# Patient Record
Sex: Female | Born: 1975 | Race: White | Hispanic: No | Marital: Married | Smoking: Never smoker
Health system: Southern US, Community
[De-identification: ages and names within clinical notes are randomized; demographics above are authoritative.]

## PROBLEM LIST (undated history)

## (undated) DIAGNOSIS — I00 Rheumatic fever without heart involvement: Secondary | ICD-10-CM

## (undated) DIAGNOSIS — G90A Postural orthostatic tachycardia syndrome (POTS): Secondary | ICD-10-CM

## (undated) DIAGNOSIS — I498 Other specified cardiac arrhythmias: Secondary | ICD-10-CM

## (undated) HISTORY — DX: Postural orthostatic tachycardia syndrome (POTS): G90.A

## (undated) HISTORY — DX: Other specified cardiac arrhythmias: I49.8

## (undated) HISTORY — PX: LAPAROSCOPY: SHX197

## (undated) HISTORY — DX: Rheumatic fever without heart involvement: I00

---

## 2020-03-15 ENCOUNTER — Ambulatory Visit: Payer: BC Managed Care – PPO | Admitting: Cardiology

## 2020-03-15 ENCOUNTER — Encounter: Payer: Self-pay | Admitting: Cardiology

## 2020-03-15 ENCOUNTER — Other Ambulatory Visit: Payer: Self-pay

## 2020-03-15 VITALS — BP 128/88 | HR 91 | Ht 66.0 in | Wt 121.0 lb

## 2020-03-15 DIAGNOSIS — G90A Postural orthostatic tachycardia syndrome (POTS): Secondary | ICD-10-CM

## 2020-03-15 DIAGNOSIS — I00 Rheumatic fever without heart involvement: Secondary | ICD-10-CM | POA: Diagnosis not present

## 2020-03-15 DIAGNOSIS — I498 Other specified cardiac arrhythmias: Secondary | ICD-10-CM | POA: Diagnosis not present

## 2020-03-15 NOTE — Patient Instructions (Signed)
Medication Instructions:  - Your physician recommends that you continue on your current medications as directed. Please refer to the Current Medication list given to you today.  *If you need a refill on your cardiac medications before your next appointment, please call your pharmacy*   Lab Work: - none ordered  If you have labs (blood work) drawn today and your tests are completely normal, you will receive your results only by: Marland Kitchen MyChart Message (if you have MyChart) OR . A paper copy in the mail If you have any lab test that is abnormal or we need to change your treatment, we will call you to review the results.   Testing/Procedures: 1) Echocardiogram - Your physician has requested that you have an echocardiogram. Echocardiography is a painless test that uses sound waves to create images of your heart. It provides your doctor with information about the size and shape of your heart and how well your heart's chambers and valves are working. This procedure takes approximately one hour. There are no restrictions for this procedure. An IV may need to be placed during your exam to inject an image enhancing agent for more optimal pictures of your heart. Please drink some water prior to coming to your exam to hydrate your veins.   Follow-Up: At [facility], you and your health needs are our priority.  As part of our continuing mission to provide you with exceptional heart care, we have created designated Provider Care Teams.  These Care Teams include your primary Cardiologist (physician) and Advanced Practice Providers (APPs -  Physician Assistants and Nurse Practitioners) who all work together to provide you with the care you need, when you need it.  We recommend signing up for the patient portal called "MyChart".  Sign up information is provided on this After Visit Summary.  MyChart is used to connect with patients for Virtual Visits (Telemedicine).  Patients are able to view lab/test results,  encounter notes, upcoming appointments, etc.  Non-urgent messages can be sent to your provider as well.   To learn more about what you can do with MyChart, go to [email or web address].    Your next appointment:   1) with Dr. Azucena Cecil:  after your echocardiogram is complete  2) referral to Dr. Graciela Husbands: known POTS (establish care)- next available  The format for your next appointment:   In Person  Provider:   as above   Other Instructions n/a

## 2020-03-15 NOTE — Progress Notes (Signed)
Cardiology Office Note:    Date:  03/15/2020   ID:  Ann Mullen, DOB 1976-06-06, MRN [id number]  PCP:  Patient, No Pcp Per  [facility] Cardiologist:  Debbe Odea, MD  [facility] Electrophysiologist:  None   Referring MD: No ref. provider found   Chief Complaint  Patient presents with  . New Patient (Initial Visit)    Establish cardiac cardiac-POTS. Hx of Rheumatic fever as a child; Meds verbally reviewed with patient.    History of Present Illness:    Ann Mullen is a 44 y.o. female with a hx of postural orthostatic tachycardia syndrome, rheumatic fever, who presents to establish care.  Patient was diagnosed with POTS about 9 years ago in [place].  She had symptoms of dysautonomia such as tachycardia, chest discomfort, dizziness, palpitations, diarrhea which have been ongoing for years.  She was placed on Celexa due to possible anxiety and developed long QT syndrome.  She was seen in the hospital and underwent work-up which led to her diagnosis.  Used to see Dr. Georgana Curio in [place] for management of POTS.  She was started on Toprol-XL 25 mg daily which has helped her symptoms.  She states doing okay although occasionally has slight dizziness with movements.  Her daughter was also diagnosed with POTS.  She states having rheumatic fever as a child.  She denies any other cardiac disease, denies smoking, denies chest pain or shortness of breath.  Past Medical History:  Diagnosis Date  . POTS (postural orthostatic tachycardia syndrome)   . Rheumatic fever in pediatric patient     Past Surgical History:  Procedure Laterality Date  . LAPAROSCOPY      Current Medications: Current Meds  Medication Sig  . cetirizine (ZYRTEC) 10 MG tablet Take 10 mg by mouth daily.  Marland Kitchen METOPROLOL SUCCINATE PO Take 25 mg by mouth daily.  . Multiple Vitamin (MULTIVITAMIN ADULT PO) Take by mouth. Most days  . potassium chloride SA (KLOR-CON) 20 MEQ tablet Take 20 mEq by mouth every other day.   . Probiotic Product (PROBIOTIC DAILY PO) Take by mouth daily.  Marland Kitchen SLYND 4 MG TABS Take 1 tablet by mouth daily.     Allergies:   Celexa [citalopram] and Darvon [propoxyphene]   Social History   Socioeconomic History  . Marital status: Married    Spouse name: Not on file  . Number of children: Not on file  . Years of education: Not on file  . Highest education level: Not on file  Occupational History  . Not on file  Tobacco Use  . Smoking status: Never Smoker  . Smokeless tobacco: Never Used  Vaping Use  . Vaping Use: Never used  Substance and Sexual Activity  . Alcohol use: Not Currently  . Drug use: Not Currently  . Sexual activity: Not on file  Other Topics Concern  . Not on file  Social History Narrative  . Not on file   Social Determinants of Health   Financial Resource Strain:   . Difficulty of Paying Living Expenses:   Food Insecurity:   . Worried About Programme researcher, broadcasting/film/video in the Last Year:   . Barista in the Last Year:   Transportation Needs:   . Freight forwarder (Medical):   Marland Kitchen Lack of Transportation (Non-Medical):   Physical Activity:   . Days of Exercise per Week:   . Minutes of Exercise per Session:   Stress:   . Feeling of Stress :  Social Connections:   . Frequency of Communication with Friends and Family:   . Frequency of Social Gatherings with Friends and Family:   . Attends Religious Services:   . Active Member of Clubs or Organizations:   . Attends Banker Meetings:   Marland Kitchen Marital Status:      Family History: The patient's family history includes Hypertension in her father.  ROS:   Please see the history of present illness.     All other systems reviewed and are negative.  EKGs/Labs/Other Studies Reviewed:    The following studies were reviewed today:   EKG:  EKG is  ordered today.  The ekg ordered today demonstrates normal sinus rhythm, normal ECG.  Recent Labs: No results found for requested labs within  last 8760 hours.  Recent Lipid Panel No results found for: CHOL, TRIG, HDL, CHOLHDL, VLDL, LDLCALC, LDLDIRECT  Physical Exam:    VS:  BP (!) 128/88 (BP Location: Right Arm, Patient Position: Sitting, Cuff Size: Normal)   Pulse 91   Ht 5\' 6"  (1.676 m)   Wt 121 lb (54.9 kg)   SpO2 98%   BMI 19.53 kg/m     Wt Readings from Last 3 Encounters:  03/15/20 121 lb (54.9 kg)     GEN:  Well nourished, well developed in no acute distress HEENT: Normal NECK: No JVD; No carotid bruits LYMPHATICS: No lymphadenopathy CARDIAC: RRR, no murmurs, rubs, gallops RESPIRATORY:  Clear to auscultation without rales, wheezing or rhonchi  ABDOMEN: Soft, non-tender, non-distended MUSCULOSKELETAL:  No edema; No deformity  SKIN: Warm and dry NEUROLOGIC:  Alert and oriented x 3 PSYCHIATRIC:  Normal affect   ASSESSMENT:    1. POTS (postural orthostatic tachycardia syndrome)   2. Rheumatic fever    PLAN:    In order of problems listed above:  1. Patient with history of POTS.  Symptoms are currently reasonably managed with Toprol-XL.  Continue Toprol-XL as prescribed.  We will have patient see EP/Dr. 03/17/20 for any additional input. 2. Patient with history of rheumatic fever.  No murmur noted on my exam.  Get echocardiogram to evaluate for any valvular pathology.  Follow-up after echocardiogram.  This note was generated in part or whole with voice recognition software. Voice recognition is usually quite accurate but there are transcription errors that can and very often do occur. I apologize for any typographical errors that were not detected and corrected.  Medication Adjustments/Labs and Tests Ordered: Current medicines are reviewed at length with the patient today.  Concerns regarding medicines are outlined above.  Orders Placed This Encounter  Procedures  . Ambulatory referral to Cardiac Electrophysiology  . EKG 12-Lead  . ECHOCARDIOGRAM COMPLETE   No orders of the defined types were placed in  this encounter.   Patient Instructions  Medication Instructions:  - Your physician recommends that you continue on your current medications as directed. Please refer to the Current Medication list given to you today.  *If you need a refill on your cardiac medications before your next appointment, please call your pharmacy*   Lab Work: - none ordered  If you have labs (blood work) drawn today and your tests are completely normal, you will receive your results only by: Graciela Husbands MyChart Message (if you have MyChart) OR . A paper copy in the mail If you have any lab test that is abnormal or we need to change your treatment, we will call you to review the results.   Testing/Procedures: 1) Echocardiogram - Your physician  has requested that you have an echocardiogram. Echocardiography is a painless test that uses sound waves to create images of your heart. It provides your doctor with information about the size and shape of your heart and how well your heart's chambers and valves are working. This procedure takes approximately one hour. There are no restrictions for this procedure. An IV may need to be placed during your exam to inject an image enhancing agent for more optimal pictures of your heart. Please drink some water prior to coming to your exam to hydrate your veins.   Follow-Up: At [facility], you and your health needs are our priority.  As part of our continuing mission to provide you with exceptional heart care, we have created designated Provider Care Teams.  These Care Teams include your primary Cardiologist (physician) and Advanced Practice Providers (APPs -  Physician Assistants and Nurse Practitioners) who all work together to provide you with the care you need, when you need it.  We recommend signing up for the patient portal called "MyChart".  Sign up information is provided on this After Visit Summary.  MyChart is used to connect with patients for Virtual Visits (Telemedicine).   Patients are able to view lab/test results, encounter notes, upcoming appointments, etc.  Non-urgent messages can be sent to your provider as well.   To learn more about what you can do with MyChart, go to [email or web address].    Your next appointment:   1) with Dr. Azucena Cecil:  after your echocardiogram is complete  2) referral to Dr. Graciela Husbands: known POTS (establish care)- next available  The format for your next appointment:   In Person  Provider:   as above   Other Instructions n/a     Signed, Debbe Odea, MD  03/15/2020 12:37 PM    [facility]

## 2020-04-05 ENCOUNTER — Other Ambulatory Visit: Payer: Self-pay | Admitting: Cardiology

## 2020-04-05 DIAGNOSIS — G90A Postural orthostatic tachycardia syndrome (POTS): Secondary | ICD-10-CM

## 2020-04-16 ENCOUNTER — Other Ambulatory Visit: Payer: Self-pay

## 2020-04-16 ENCOUNTER — Ambulatory Visit (INDEPENDENT_AMBULATORY_CARE_PROVIDER_SITE_OTHER): Payer: BC Managed Care – PPO

## 2020-04-16 DIAGNOSIS — I498 Other specified cardiac arrhythmias: Secondary | ICD-10-CM

## 2020-04-16 DIAGNOSIS — G90A Postural orthostatic tachycardia syndrome (POTS): Secondary | ICD-10-CM

## 2020-04-17 LAB — ECHOCARDIOGRAM COMPLETE
AR max vel: 2.54 cm2
AV Area VTI: 2.53 cm2
AV Area mean vel: 2.41 cm2
AV Mean grad: 4 mmHg
AV Peak grad: 7.7 mmHg
Ao pk vel: 1.39 m/s
Area-P 1/2: 2.59 cm2
Calc EF: 59.9 %
S' Lateral: 2.68 cm
Single Plane A2C EF: 62 %
Single Plane A4C EF: 56.1 %

## 2020-04-22 ENCOUNTER — Ambulatory Visit: Payer: BC Managed Care – PPO | Admitting: Cardiology

## 2020-05-30 ENCOUNTER — Encounter: Payer: Self-pay | Admitting: Internal Medicine

## 2020-05-30 ENCOUNTER — Other Ambulatory Visit: Payer: Self-pay

## 2020-05-30 ENCOUNTER — Ambulatory Visit: Payer: BC Managed Care – PPO | Admitting: Internal Medicine

## 2020-05-30 VITALS — Ht 66.0 in | Wt 134.8 lb

## 2020-05-30 DIAGNOSIS — E876 Hypokalemia: Secondary | ICD-10-CM

## 2020-05-30 DIAGNOSIS — I10 Essential (primary) hypertension: Secondary | ICD-10-CM | POA: Diagnosis not present

## 2020-05-30 DIAGNOSIS — I498 Other specified cardiac arrhythmias: Secondary | ICD-10-CM

## 2020-05-30 DIAGNOSIS — G90A Postural orthostatic tachycardia syndrome (POTS): Secondary | ICD-10-CM

## 2020-05-30 NOTE — Patient Instructions (Signed)
Medication Instructions:  - Your physician recommends that you continue on your current medications as directed. Please refer to the Current Medication list given to you today.  *If you need a refill on your cardiac medications before your next appointment, please call your pharmacy*   Lab Work: - Your physician recommends that you have lab work today: renin-aldosterone level  - Medical Mall entrance at [facility] - 1st desk on the right to check in  - Lab hours: Monday- Friday (7:30 am- 5:30 pm)  If you have labs (blood work) drawn today and your tests are completely normal, you will receive your results only by: Marland Kitchen MyChart Message (if you have MyChart) OR . A paper copy in the mail If you have any lab test that is abnormal or we need to change your treatment, we will call you to review the results.   Testing/Procedures: - none ordered   Follow-Up: At [facility], you and your health needs are our priority.  As part of our continuing mission to provide you with exceptional heart care, we have created designated Provider Care Teams.  These Care Teams include your primary Cardiologist (physician) and Advanced Practice Providers (APPs -  Physician Assistants and Nurse Practitioners) who all work together to provide you with the care you need, when you need it.  We recommend signing up for the patient portal called "MyChart".  Sign up information is provided on this After Visit Summary.  MyChart is used to connect with patients for Virtual Visits (Telemedicine).  Patients are able to view lab/test results, encounter notes, upcoming appointments, etc.  Non-urgent messages can be sent to your provider as well.   To learn more about what you can do with MyChart, go to [email or web address].    Your next appointment:   6 week(s)  The format for your next appointment:   In Person  Provider:   Sherryl Manges, MD   Other Instructions n/a

## 2020-05-30 NOTE — Progress Notes (Addendum)
ELECTROPHYSIOLOGY CONSULT NOTE  Patient ID: Ann Mullen, MRN: [id number], DOB/AGE: August 04, 1976 44 y.o. Admit date: (Not on file) Date of Consult: 05/30/2020  Primary Physician: Patient, No Pcp Per Primary Cardiologist: new       HPI Ann Mullen is a 44 y.o. female was diagnosed with POTS at [facility] under the care of Dr. Berneice Gandy having had a longstanding history of palpitations and chest pain which has been ascribed to rheumatic fever  She was then treated with metoprolol fluids and salt and has done okay over the years but remains quite limited with orthostatic intolerance, shower intolerance, menses intolerance.  She has also had significant problems with GI symptoms with pain constipation and diarrhea with an interval diagnosis of endometriosis.  She has significant headaches sometimes described as migraines.  Sleeps well but wakes fatigued.  Struggles with "brain fog "  Developed urticaria and saw allergist.  Allergy testing was negative.  Has had a history of recurrent hypokalemia and was previously on SSRI and developed QT prolongation.  The cause of her hypokalemia has not apparently been elucidated.  She has had problems with elevated blood pressure and more recently low borderline hypertension.  She is tearful.  She has 3 children age of whom has significant medical illnesses including her eldest with POTS her second with ulcerative colitis and referred with asthma  Recently moved to the area as her husband is an Event organiser at [employer],   Does not exercise    Sleep disordered breathing and daytime somnolence and fatigue    DATE TEST EF   8/21 Echo 60-65 %         Date Cr K Hgb                 Past Medical History:  Diagnosis Date  . POTS (postural orthostatic tachycardia syndrome)   . Rheumatic fever in pediatric patient       Surgical History:  Past Surgical History:  Procedure Laterality Date  . LAPAROSCOPY       Home  Meds: Current Meds  Medication Sig  . cetirizine (ZYRTEC) 10 MG tablet Take 10 mg by mouth daily.  Marland Kitchen METOPROLOL SUCCINATE PO Take 25 mg by mouth daily.  . Multiple Vitamin (MULTIVITAMIN ADULT PO) Take by mouth. Most days  . potassium chloride SA (KLOR-CON) 20 MEQ tablet Take 20 mEq by mouth every other day.  . Probiotic Product (PROBIOTIC DAILY PO) Take by mouth daily.  Marland Kitchen SLYND 4 MG TABS Take 1 tablet by mouth daily.    Allergies:  Allergies  Allergen Reactions  . Celexa [Citalopram]   . Darvon [Propoxyphene]     Social History   Socioeconomic History  . Marital status: Married    Spouse name: Not on file  . Number of children: Not on file  . Years of education: Not on file  . Highest education level: Not on file  Occupational History  . Not on file  Tobacco Use  . Smoking status: Never Smoker  . Smokeless tobacco: Never Used  Vaping Use  . Vaping Use: Never used  Substance and Sexual Activity  . Alcohol use: Not Currently  . Drug use: Not Currently  . Sexual activity: Not on file  Other Topics Concern  . Not on file  Social History Narrative  . Not on file   Social Determinants of Health   Financial Resource Strain:   . Difficulty of Paying Living Expenses: Not  on file  Food Insecurity:   . Worried About Programme researcher, broadcasting/film/video in the Last Year: Not on file  . Ran Out of Food in the Last Year: Not on file  Transportation Needs:   . Lack of Transportation (Medical): Not on file  . Lack of Transportation (Non-Medical): Not on file  Physical Activity:   . Days of Exercise per Week: Not on file  . Minutes of Exercise per Session: Not on file  Stress:   . Feeling of Stress : Not on file  Social Connections:   . Frequency of Communication with Friends and Family: Not on file  . Frequency of Social Gatherings with Friends and Family: Not on file  . Attends Religious Services: Not on file  . Active Member of Clubs or Organizations: Not on file  . Attends Tax inspector Meetings: Not on file  . Marital Status: Not on file  Intimate Partner Violence:   . Fear of Current or Ex-Partner: Not on file  . Emotionally Abused: Not on file  . Physically Abused: Not on file  . Sexually Abused: Not on file     Family History  Problem Relation Age of Onset  . Hypertension Father      ROS:  Please see the history of present illness.     All other systems reviewed and negative.    Physical Exam Ht 5\' 6"  (1.676 m)   Wt 134 lb 12.8 oz (61.1 kg)   BMI 21.76 kg/m   Height 5\' 6"  (1.676 m), weight 134 lb 12.8 oz (61.1 kg). General: Well developed, well nourished female in no acute distress. Head: Normocephalic, atraumatic, sclera non-icteric, no xanthomas, nares are without discharge. EENT: normal  Lymph Nodes:  none Neck: Negative for carotid bruits. JVD not elevated. Back:without scoliosis kyphosis  Lungs: Clear bilaterally to auscultation without wheezes, rales, or rhonchi. Breathing is unlabored. Heart: RRR with S1 S2. No *  murmur . No rubs, or gallops appreciated. Abdomen: Soft, non-tender, non-distended with normoactive bowel sounds. No hepatomegaly. No rebound/guarding. No obvious abdominal masses. Msk:  Strength and tone appear normal for age. Extremities: No clubbing or cyanosis. No  edema.  Distal pedal pulses are 2+ and equal bilaterally. Skin: Warm and Dry Neuro: Alert and oriented X 3. CN III-XII intact Grossly normal sensory and motor function . Psych:  Responds to questions appropriately with a normal affect;intermittently tearful      Labs: Cardiac Enzymes No results for input(s): CKTOTAL, CKMB, TROPONINI in the last 72 hours. CBC No results found for: WBC, HGB, HCT, MCV, PLT PROTIME: No results for input(s): LABPROT, INR in the last 72 hours. Chemistry No results for input(s): NA, K, CL, CO2, BUN, CREATININE, CALCIUM, PROT, BILITOT, ALKPHOS, ALT, AST, GLUCOSE in the last 168 hours.  Invalid input(s): LABALBU Lipids No  results found for: CHOL, HDL, LDLCALC, TRIG BNP No results found for: PROBNP Thyroid Function Tests: No results for input(s): TSH, T4TOTAL, T3FREE, THYROIDAB in the last 72 hours.  Invalid input(s): FREET3 Miscellaneous No results found for: DDIMER  Radiology/Studies:  No results found.  EKG: Sinus at 72 Intervals 07/31/1936 I spoke to the patient's niece.  She would like to do a loop and not a pacemaker.  She used to work for .  Now for community health.   Assessment and Plan:  POTS   Elevated blood pressure and hypokalemia  Anxiety  Endometriosis  Headache  Sleep disordered breathing and day time somnolence   The patient was  diagnosed with POTS by Dr. Berneice Gandy.  Currently her orthostatics and review of interval orthostatics were relatively unimpressive which is a good thing.  Still with symptoms of orthostatic intolerance and exercise intolerance.  Discussed the physiology and the potential benefits of compressive wear.  (Her daughter also has POTS struggles with fluid intake and compressive wear may be of some benefit for her as well)  Discussed the losses of lifes opportunities assoc with her chronic illness--intermittently tearful. Introduced restoration place   The constellation of symptoms, POTS, GI possibly IBS, endometriosis, headaches, urticaria hypokalemia all raise the specter of a unifying diagnosis which is beyond my ken I suggested that she reach out to her primary care physician in [place] and consider referral to the [facility] to see if there is in some way to bring all of her symptoms underwent understanding.    Sherryl Manges

## 2020-06-10 ENCOUNTER — Telehealth: Payer: Self-pay | Admitting: Internal Medicine

## 2020-06-10 NOTE — Telephone Encounter (Signed)
Patient states Dr. Graciela Husbands asked her to call to discuss sleep study appointment

## 2020-06-20 NOTE — Telephone Encounter (Signed)
Late entry- I spoke with the patient early last week.  I advised her that Dr. Graciela Husbands had mentioned to me she might need a home sleep study, but this was not mentioned in his note and I was trying to recall the reasoning behind her needing this done.  Per the patient, they had talked about her having issues with daytime sleepiness.  I advised her I would follow up with trying to get this ordered for her.  I have spoken with [facility] staff and they have sent me the protocol for the home sleep studies.  I have advised Dr. Graciela Husbands he will need to update his clinic note to state that the patient needs the home sleep study and why.  I can then proceed with ordering this test for the patient.

## 2020-06-26 ENCOUNTER — Other Ambulatory Visit
Admission: RE | Admit: 2020-06-26 | Discharge: 2020-06-26 | Disposition: A | Discharge status: Home / Self Care | Payer: BC Managed Care – PPO | Source: Ambulatory Visit | Attending: Internal Medicine | Admitting: Internal Medicine

## 2020-06-26 DIAGNOSIS — E876 Hypokalemia: Secondary | ICD-10-CM | POA: Diagnosis not present

## 2020-06-26 DIAGNOSIS — I10 Essential (primary) hypertension: Secondary | ICD-10-CM

## 2020-07-02 LAB — ALDOSTERONE + RENIN ACTIVITY W/ RATIO
ALDO / PRA Ratio: 10.2 (ref 0.0–30.0)
Aldosterone: 19.6 ng/dL (ref 0.0–30.0)
PRA LC/MS/MS: 1.914 ng/mL/hr (ref 0.167–5.380)

## 2020-07-12 ENCOUNTER — Other Ambulatory Visit: Payer: Self-pay | Admitting: *Deleted

## 2020-07-12 ENCOUNTER — Telehealth: Payer: Self-pay | Admitting: *Deleted

## 2020-07-12 DIAGNOSIS — R400 Somnolence: Secondary | ICD-10-CM

## 2020-07-12 DIAGNOSIS — G4730 Sleep apnea, unspecified: Secondary | ICD-10-CM

## 2020-07-12 NOTE — Telephone Encounter (Signed)
-----   Message from Jefferey Pica, RN sent at 07/12/2020  2:39 PM EST ----- Home sleep study order placed per Dr. Graciela Husbands. Dx - daytime somnulence & sleep disordered breathing

## 2020-07-15 ENCOUNTER — Telehealth: Payer: Self-pay | Admitting: Internal Medicine

## 2020-07-15 NOTE — Telephone Encounter (Signed)
No PA required.  Called patient and lm that she is scheduled for December 17 at 1:30 pm, to expect the info packet and left the sleep lab number for her.

## 2020-07-16 ENCOUNTER — Ambulatory Visit: Payer: BC Managed Care – PPO | Admitting: Internal Medicine

## 2020-07-16 ENCOUNTER — Other Ambulatory Visit: Payer: Self-pay

## 2020-07-16 ENCOUNTER — Encounter: Payer: Self-pay | Admitting: Internal Medicine

## 2020-07-16 VITALS — Ht 66.0 in | Wt 136.0 lb

## 2020-07-16 DIAGNOSIS — I498 Other specified cardiac arrhythmias: Secondary | ICD-10-CM | POA: Diagnosis not present

## 2020-07-16 DIAGNOSIS — G90A Postural orthostatic tachycardia syndrome (POTS): Secondary | ICD-10-CM

## 2020-07-16 DIAGNOSIS — I10 Essential (primary) hypertension: Secondary | ICD-10-CM | POA: Diagnosis not present

## 2020-07-16 NOTE — Patient Instructions (Signed)
Medication Instructions:  - Your physician recommends that you continue on your current medications as directed. Please refer to the Current Medication list given to you today.  *If you need a refill on your cardiac medications before your next appointment, please call your pharmacy*   Lab Work: - none ordered  If you have labs (blood work) drawn today and your tests are completely normal, you will receive your results only by: Marland Kitchen MyChart Message (if you have MyChart) OR . A paper copy in the mail If you have any lab test that is abnormal or we need to change your treatment, we will call you to review the results.   Testing/Procedures: - none ordered   Follow-Up: At [facility], you and your health needs are our priority.  As part of our continuing mission to provide you with exceptional heart care, we have created designated Provider Care Teams.  These Care Teams include your primary Cardiologist (physician) and Advanced Practice Providers (APPs -  Physician Assistants and Nurse Practitioners) who all work together to provide you with the care you need, when you need it.  We recommend signing up for the patient portal called "MyChart".  Sign up information is provided on this After Visit Summary.  MyChart is used to connect with patients for Virtual Visits (Telemedicine).  Patients are able to view lab/test results, encounter notes, upcoming appointments, etc.  Non-urgent messages can be sent to your provider as well.   To learn more about what you can do with MyChart, go to [email or web address].    Your next appointment:   6 month(s)  The format for your next appointment:   In Person  Provider:   Sherryl Manges, MD   Other Instructions - Dr. Graciela Husbands will be glad to see your daughter in consultation (Orthostatic Intolerance)  - Feel free to make an appointment at checkout today for her or call us at [phone] to schedule

## 2020-07-16 NOTE — Telephone Encounter (Signed)
I have been in contact with Veda Canning, CMA (sleep). The patient has been contacted regarding her home sleep study. She has an appointment on 08/09/20 with Dr. Tresa Endo.   The patient was seen by Dr. Graciela Husbands in office today and she is aware of this appointment.

## 2020-07-16 NOTE — Progress Notes (Signed)
      Patient Care Team: Kandyce Rud, MD as PCP - General (Family Medicine) Debbe Odea, MD as PCP - Cardiology (Cardiology)   HPI  Ann Mullen is a 44 y.o. female seen in follow-up for POTS diagnosed at [facility] by Dr. Berneice Gandy. Symptomatic orthostatic intolerance, albeit recently with negative objective findings.  Has a multitude of associated symptoms including urticaria, IBS, endometriosis raising the possibility of a unifying diagnosis  Significant associated stresses with acute aggravations with her son getting Covid, her other son having aggravation of his UC.  She has not been intentional about salt in her water repletion.  Her daughter has POTS.  She was seen by the [facility] in her primary care raising questions about her medications.  Her diagnosis was made at [facility] children's.  Sleep disturbance and daytime somnolence  Records and Results Reviewed   Past Medical History:  Diagnosis Date  . POTS (postural orthostatic tachycardia syndrome)   . Rheumatic fever in pediatric patient     Past Surgical History:  Procedure Laterality Date  . LAPAROSCOPY      Current Meds  Medication Sig  . cetirizine (ZYRTEC) 10 MG tablet Take 10 mg by mouth daily.  Marland Kitchen METOPROLOL SUCCINATE PO Take 25 mg by mouth daily.  . Multiple Vitamin (MULTIVITAMIN ADULT PO) Take by mouth. Most days  . potassium chloride SA (KLOR-CON) 20 MEQ tablet Take 20 mEq by mouth every other day.  . Probiotic Product (PROBIOTIC DAILY PO) Take by mouth daily.  Marland Kitchen SLYND 4 MG TABS Take 1 tablet by mouth daily.    Allergies  Allergen Reactions  . Celexa [Citalopram]   . Darvon [Propoxyphene]       Review of Systems negative except from HPI and PMH  Physical Exam Ht 5\' 6"  (1.676 m)   Wt 136 lb (61.7 kg)   BMI 21.95 kg/m  Well developed and well nourished in no acute distress HENT normal E scleral and icterus clear Neck Supple JVP flat; carotids brisk and full Clear to ausculation   Regular rate and rhythm, no murmurs gallops or rub Soft with active bowel sounds No clubbing cyanosis  } Edema Alert and oriented, grossly normal motor and sensory function Skin Warm and Dry  ECG    CrCl cannot be calculated (No successful lab value found.).   Assessment and  Plan  POTS   Elevated blood pressure and hypokalemia  Anxiety  Endometriosis  Headache  Sleep disordered breathing and day time somnolence   Renin/hyper Aldo evaluation was negative. Blood pressure is borderline elevated.  We will continue metoprolol.  We did discuss the potential side effects of metoprolol including sleep disturbance, GI disturbance.  She will consider a 2-week off trial to assess.  Encouraged again the importance of salt water and exercise.  Heart rate excursion with standing with much greater this time than last time.  Reinforcing the need for the above.   Current medicines are reviewed at length with the patient today .  The patient does   have concerns regarding medicines and whether metoprolol was an appropriate drug based on questions arising from the care of her daughter

## 2020-08-09 ENCOUNTER — Ambulatory Visit (HOSPITAL_BASED_OUTPATIENT_CLINIC_OR_DEPARTMENT_OTHER): Payer: BC Managed Care – PPO | Attending: Internal Medicine | Admitting: Cardiovascular Disease

## 2020-08-09 ENCOUNTER — Other Ambulatory Visit: Payer: Self-pay

## 2020-08-09 DIAGNOSIS — R400 Somnolence: Secondary | ICD-10-CM

## 2020-08-09 DIAGNOSIS — G4730 Sleep apnea, unspecified: Secondary | ICD-10-CM

## 2020-08-09 DIAGNOSIS — G4733 Obstructive sleep apnea (adult) (pediatric): Secondary | ICD-10-CM | POA: Diagnosis not present

## 2020-08-20 ENCOUNTER — Encounter (HOSPITAL_BASED_OUTPATIENT_CLINIC_OR_DEPARTMENT_OTHER): Payer: Self-pay | Admitting: Cardiovascular Disease

## 2020-08-20 NOTE — Procedures (Signed)
     Patient Name: Ann Mullen, Koch Date: 08/10/2020 Gender: Female D.O.B: 11-20-1975 Age (years): 44 Referring Provider: Sherryl Manges Height (inches): 66 Interpreting Physician: Nicki Guadalajara MD, ABSM Weight (lbs): 135 RPSGT: Lowry Ram BMI: 22 MRN: [id number] Neck Size: 13.00  CLINICAL INFORMATION Sleep Study Type: HST  Indication for sleep study: daytime somnolence  Epworth Sleepiness Score: 9  SLEEP STUDY TECHNIQUE A multi-channel overnight portable sleep study was performed. The channels recorded were: nasal airflow, thoracic respiratory movement, and oxygen saturation with a pulse oximetry. Snoring was also monitored.  MEDICATIONS cetirizine (ZYRTEC) 10 MG tablet METOPROLOL SUCCINATE PO Multiple Vitamin (MULTIVITAMIN ADULT PO) potassium chloride SA (KLOR-CON) 20 MEQ tablet Probiotic Product (PROBIOTIC DAILY PO) SLYND 4 MG TABS Patient self administered medications include: N/A.  SLEEP ARCHITECTURE Patient was studied for 330.5 minutes. The sleep efficiency was 100.0 % and the patient was supine for 52.6%. The arousal index was 0.0 per hour.  RESPIRATORY PARAMETERS The overall AHI was 8.0 per hour, with a central apnea index of 0.0 per hour. Events were more prominent with supine sleep (AHI 12.4/h) versus non-supine sleep (AHI 3.1/h).  The oxygen nadir was 92% during sleep.  CARDIAC DATA Mean heart rate during sleep was 67.1 bpm.  IMPRESSIONS - Mild obstructive sleep apnea occurred during this study (AHI 8.0/h). Events were more prominent with supine sleep (AHI 12.4/h). The severity during REM sleep cannot be assessed on this home study. - No significant central sleep apnea occurred during this study (CAI 0.0/h). - The patient had  no oxygen desaturation during the study (Min O2 92%) - No snoring was audible during this study.  DIAGNOSIS - Obstructive Sleep Apnea (G47.33)  RECOMMENDATIONS - In this patient with mild sleep apnea consider a  therapeutic CPAP titration to determine optimal pressure required to alleviate sleep disordered breathing. Auto PAP therapy can be considered with initial 6- 14 cm of water trial. - Effort should be made to optimize nasal and oropharyngeal patency. - The patient shouold be advised to avoid supine sleep; consider positional therapy. - Alternatives to CPAP can be considered such as a customized oral appliance. - Avoid alcohol, sedatives and other CNS depressants that may worsen sleep apnea and disrupt normal sleep architecture. - Sleep hygiene should be reviewed to assess factors that may improve sleep quality. - Weight management and regular exercise should be initiated or continued. - Recommend a sleep clinic evaluation following initiation of therapy or to discuss therapeutic options.   [Electronically signed] 08/20/2020 06:26 PM  Nicki Guadalajara MD, [facility], ABSM Diplomate, American Board of Sleep Medicine   NPI: [id number] [facility] PH: [phone]   FX: [phone] ACCREDITED BY THE AMERICAN ACADEMY OF SLEEP MEDICINE

## 2020-08-21 ENCOUNTER — Telehealth: Payer: Self-pay | Admitting: *Deleted

## 2020-08-21 ENCOUNTER — Other Ambulatory Visit: Payer: Self-pay | Admitting: Internal Medicine

## 2020-08-21 DIAGNOSIS — G4733 Obstructive sleep apnea (adult) (pediatric): Secondary | ICD-10-CM

## 2020-08-21 DIAGNOSIS — R400 Somnolence: Secondary | ICD-10-CM

## 2020-08-21 NOTE — Telephone Encounter (Signed)
-----   Message from Lennette Bihari, MD sent at 08/20/2020  6:31 PM EST ----- Cor[facility]e [place], please notify pt and set up for CPAP or possible Auto-PAP if patient interested in pursuing therapy

## 2020-08-21 NOTE — Telephone Encounter (Signed)
Informed patient of sleep study results and patient understanding was verbalized. Patient understands her sleep study showed   IMPRESSIONS - Mild obstructive sleep apnea occurred during this study (AHI 8.0/h). Events were more prominent with supine sleep (AHI 12.4/h). The severity during REM sleep cannot be assessed on this home study. - The patient had  no oxygen desaturation during the study (Min O2 92%) - No snoring was audible during this study.  DIAGNOSIS - Obstructive Sleep Apnea (G47.33)  RECOMMENDATIONS - In this patient with mild sleep apnea consider a therapeutic CPAP titration to determine optimal pressure required to alleviate sleep disordered breathing. Auto PAP therapy can be considered with initial 6- 14 cm of water trial.  Patient will call back after thinking things over

## 2020-10-01 DIAGNOSIS — I498 Other specified cardiac arrhythmias: Secondary | ICD-10-CM | POA: Diagnosis not present

## 2020-10-01 DIAGNOSIS — Z79899 Other long term (current) drug therapy: Secondary | ICD-10-CM | POA: Diagnosis not present

## 2020-10-01 DIAGNOSIS — Z0000 Encounter for general adult medical examination without abnormal findings: Secondary | ICD-10-CM | POA: Diagnosis not present

## 2020-10-01 DIAGNOSIS — E876 Hypokalemia: Secondary | ICD-10-CM | POA: Diagnosis not present

## 2020-10-17 NOTE — Telephone Encounter (Signed)
Patient is scheduled for CPAP Titration on 10/19/20. Patient understands her titration study will be done at [facility] sleep lab. Patient understands she will receive a letter in a week or so detailing appointment, date, time, and location. Patient understands to call if she does not receive the letter  in a timely manner. Left detailed message on voicemail and informed patient to call back with questions or to reschedule.

## 2020-10-19 ENCOUNTER — Encounter (HOSPITAL_BASED_OUTPATIENT_CLINIC_OR_DEPARTMENT_OTHER): Payer: BC Managed Care – PPO | Admitting: Cardiovascular Disease

## 2020-11-20 ENCOUNTER — Other Ambulatory Visit: Payer: Self-pay | Admitting: Internal Medicine

## 2020-11-21 NOTE — Telephone Encounter (Signed)
This is a Elaine pt 

## 2020-11-21 NOTE — Telephone Encounter (Signed)
This is a historical medication, please verify if refill for Potassium is appropriate. If appropriate, please send refill for Potassium to the pharmacy on file.   Thank you.

## 2020-11-21 NOTE — Telephone Encounter (Signed)
Okay to refill? 

## 2020-12-04 DIAGNOSIS — E876 Hypokalemia: Secondary | ICD-10-CM | POA: Diagnosis not present

## 2020-12-05 DIAGNOSIS — Z01419 Encounter for gynecological examination (general) (routine) without abnormal findings: Secondary | ICD-10-CM | POA: Diagnosis not present

## 2020-12-05 DIAGNOSIS — Z1231 Encounter for screening mammogram for malignant neoplasm of breast: Secondary | ICD-10-CM | POA: Diagnosis not present

## 2020-12-23 DIAGNOSIS — E876 Hypokalemia: Secondary | ICD-10-CM | POA: Diagnosis not present

## 2021-01-27 DIAGNOSIS — M7661 Achilles tendinitis, right leg: Secondary | ICD-10-CM | POA: Diagnosis not present

## 2021-01-27 DIAGNOSIS — M79671 Pain in right foot: Secondary | ICD-10-CM | POA: Diagnosis not present

## 2021-01-27 DIAGNOSIS — M216X1 Other acquired deformities of right foot: Secondary | ICD-10-CM | POA: Diagnosis not present

## 2021-01-27 DIAGNOSIS — M722 Plantar fascial fibromatosis: Secondary | ICD-10-CM | POA: Diagnosis not present

## 2021-01-28 ENCOUNTER — Other Ambulatory Visit: Payer: Self-pay | Admitting: Family Medicine

## 2021-01-28 ENCOUNTER — Other Ambulatory Visit: Payer: Self-pay | Admitting: Obstetrics and Gynecology

## 2021-01-28 DIAGNOSIS — Z1231 Encounter for screening mammogram for malignant neoplasm of breast: Secondary | ICD-10-CM

## 2021-01-30 ENCOUNTER — Other Ambulatory Visit: Payer: Self-pay

## 2021-01-30 ENCOUNTER — Ambulatory Visit
Admission: RE | Admit: 2021-01-30 | Discharge: 2021-01-30 | Disposition: A | Discharge status: Home / Self Care | Payer: BC Managed Care – PPO | Source: Ambulatory Visit | Attending: Obstetrics and Gynecology | Admitting: Obstetrics and Gynecology

## 2021-01-30 DIAGNOSIS — Z1231 Encounter for screening mammogram for malignant neoplasm of breast: Secondary | ICD-10-CM

## 2021-02-06 ENCOUNTER — Ambulatory Visit: Payer: BC Managed Care – PPO | Admitting: Internal Medicine

## 2021-02-06 ENCOUNTER — Other Ambulatory Visit: Payer: Self-pay

## 2021-02-06 ENCOUNTER — Encounter: Payer: Self-pay | Admitting: Internal Medicine

## 2021-02-06 VITALS — BP 127/83 | HR 71 | Ht 66.0 in | Wt 142.8 lb

## 2021-02-06 DIAGNOSIS — I498 Other specified cardiac arrhythmias: Secondary | ICD-10-CM | POA: Diagnosis not present

## 2021-02-06 DIAGNOSIS — G4730 Sleep apnea, unspecified: Secondary | ICD-10-CM

## 2021-02-06 DIAGNOSIS — G90A Postural orthostatic tachycardia syndrome (POTS): Secondary | ICD-10-CM

## 2021-02-06 DIAGNOSIS — I10 Essential (primary) hypertension: Secondary | ICD-10-CM

## 2021-02-06 NOTE — Patient Instructions (Signed)
Medication Instructions:  ?- Your physician recommends that you continue on your current medications as directed. Please refer to the Current Medication list given to you today. ? ?*If you need a refill on your cardiac medications before your next appointment, please call your pharmacy* ? ? ?Lab Work: ?- none ordered ? ?If you have labs (blood work) drawn today and your tests are completely normal, you will receive your results only by: ?MyChart Message (if you have MyChart) OR ?A paper copy in the mail ?If you have any lab test that is abnormal or we need to change your treatment, we will call you to review the results. ? ? ?Testing/Procedures: ?- none ordered ? ? ?Follow-Up: ?At [facility], you and your health needs are our priority.  As part of our continuing mission to provide you with exceptional heart care, we have created designated Provider Care Teams.  These Care Teams include your primary Cardiologist (physician) and Advanced Practice Providers (APPs -  Physician Assistants and Nurse Practitioners) who all work together to provide you with the care you need, when you need it. ? ?We recommend signing up for the patient portal called "MyChart".  Sign up information is provided on this After Visit Summary.  MyChart is used to connect with patients for Virtual Visits (Telemedicine).  Patients are able to view lab/test results, encounter notes, upcoming appointments, etc.  Non-urgent messages can be sent to your provider as well.   ?To learn more about what you can do with MyChart, go to [email or web address].   ? ?Your next appointment:   ?6 month(s) ? ?The format for your next appointment:   ?In Person ? ?Provider:   ?Steven Klein, MD  ? ? ?Other Instructions ?N/a ? ?

## 2021-02-06 NOTE — Progress Notes (Signed)
Patient ID: Ann Mullen, female   DOB: 05/02/1976, 45 y.o.   MRN: [id number]       Patient Care Team: Dorothey Baseman, MD as PCP - General (Family Medicine) Debbe Odea, MD as PCP - Cardiology (Cardiology)   HPI  Ann Mullen is a 45 y.o. female seen in follow-up for POTS diagnosed at [facility] by Dr. Berneice Gandy.  She has a history of borderline elevated blood pressure and has been treated with beta-blockers. Symptomatic orthostatic intolerance, albeit recently with negative objective findings.  Has a multitude of associated symptoms including urticaria, IBS, endometriosis raising the possibility of a unifying diagnosis  Significant associated stresses with acute aggravations with her son getting Covid, her other son having aggravation of his UC.  She has not been intentional about salt in her water repletion.  Her daughter has POTS.  She was seen by the [facility] in her primary care raising questions about her medications.  Her diagnosis was made at [facility] children's.  The recent heat wave have caused some lightheadedness  Depleted of H20 and Lack of exercise   Will be going on a hike with friend on trails as tolerated due to Plantar fascitis flare: will be getting orthotics fitted   The patient denies chest pain, shortness of breath, nocturnal dyspnea, orthopnea or peripheral edema.  There have been no palpitations, lightheadedness or syncope.    DATE TEST EF   08/21 Echo  60-65 %    Date Cr K Hgb  5/22 0.9 3.9 13.5 (2/22)    Past Medical History:  Diagnosis Date   POTS (postural orthostatic tachycardia syndrome)    Rheumatic fever in pediatric patient     Past Surgical History:  Procedure Laterality Date   LAPAROSCOPY      Current Meds  Medication Sig   celecoxib (CELEBREX) 200 MG capsule Take 200 mg by mouth daily as needed.   cetirizine (ZYRTEC) 10 MG tablet Take 10 mg by mouth daily.   fluticasone (FLONASE) 50 MCG/ACT nasal spray fluticasone propionate 50  mcg/actuation nasal spray,suspension   ibuprofen (ADVIL) 600 MG tablet ibuprofen 600 mg tablet  PRN   metoprolol succinate (TOPROL-XL) 25 MG 24 hr tablet Take 1 tablet (25 mg total) by mouth daily.   Multiple Vitamin (MULTIVITAMIN ADULT PO) Take by mouth. Most days   potassium chloride SA (KLOR-CON) 20 MEQ tablet TAKE ONE TABLET BY MOUTH EVERY OTHER DAY   Probiotic Product (PROBIOTIC DAILY PO) Take by mouth daily.   SLYND 4 MG TABS Take 1 tablet by mouth daily.    Allergies  Allergen Reactions   Celexa [Citalopram]    Darvon [Propoxyphene]     Review of Systems negative except from HPI and PMH  Physical Exam: BP 127/83 (BP Location: Left Arm, Patient Position: Sitting, Cuff Size: Normal)   Pulse 71   Ht 5\' 6"  (1.676 m)   Wt 142 lb 12.8 oz (64.8 kg)   LMP 01/22/2021 Comment: takes birth control  SpO2 99%   BMI 23.05 kg/m  Well developed and nourished in no acute distress HENT normal Neck supple with JVP-  fla Clear Regular rate and rhythm, no murmurs or gallops Abd-soft with active BS No Clubbing cyanosis edema Skin-warm and dry A & Oriented  Grossly normal sensory and motor function  ECG sinus  71 \\15 /08/37 CrCl cannot be calculated (No successful lab value found.).   Assessment and  Plan  POTS    Elevated blood pressure and hypokalemia   Anxiety   Endometriosis  Headache   Sleep disordered breathing and day time somnolence    POTS symptoms are relatively quiescient. We discussed extensively the issues of dysautonomia, the physiology of orthstasis and positional stress.  We discussed the role of salt and water repletion, the importance of exercise, often needing to be started in the recumbent position, and the awareness of triggers and the role of ambient heat and dehydration reiterating the importance of fluid  Tolerating the lower dosing of potassium; last potassium levels 5/22 were normal  Less stress with the summer    I,Stephanie Williams,acting  as a scribe for Sherryl Manges, MD.,have documented all relevant documentation on the behalf of Sherryl Manges, MD,as directed by  Sherryl Manges, MD while in the presence of Sherryl Manges, MD.  I, Sherryl Manges, MD, have reviewed all documentation for this visit. The documentation on 02/06/21 for the exam, diagnosis, procedures, and orders are all accurate and complete.

## 2021-02-12 ENCOUNTER — Other Ambulatory Visit: Payer: Self-pay | Admitting: Obstetrics and Gynecology

## 2021-02-12 DIAGNOSIS — N6310 Unspecified lump in the right breast, unspecified quadrant: Secondary | ICD-10-CM

## 2021-02-12 DIAGNOSIS — N6320 Unspecified lump in the left breast, unspecified quadrant: Secondary | ICD-10-CM

## 2021-02-12 DIAGNOSIS — R928 Other abnormal and inconclusive findings on diagnostic imaging of breast: Secondary | ICD-10-CM

## 2021-02-17 DIAGNOSIS — M216X1 Other acquired deformities of right foot: Secondary | ICD-10-CM | POA: Diagnosis not present

## 2021-02-17 DIAGNOSIS — M216X2 Other acquired deformities of left foot: Secondary | ICD-10-CM | POA: Diagnosis not present

## 2021-02-17 DIAGNOSIS — M79671 Pain in right foot: Secondary | ICD-10-CM | POA: Diagnosis not present

## 2021-02-17 DIAGNOSIS — M722 Plantar fascial fibromatosis: Secondary | ICD-10-CM | POA: Diagnosis not present

## 2021-02-27 ENCOUNTER — Ambulatory Visit
Admission: RE | Admit: 2021-02-27 | Discharge: 2021-02-27 | Disposition: A | Discharge status: Home / Self Care | Payer: BC Managed Care – PPO | Source: Ambulatory Visit | Attending: Obstetrics and Gynecology | Admitting: Obstetrics and Gynecology

## 2021-02-27 ENCOUNTER — Other Ambulatory Visit: Payer: Self-pay

## 2021-02-27 DIAGNOSIS — N6320 Unspecified lump in the left breast, unspecified quadrant: Secondary | ICD-10-CM

## 2021-02-27 DIAGNOSIS — N6489 Other specified disorders of breast: Secondary | ICD-10-CM | POA: Diagnosis not present

## 2021-02-27 DIAGNOSIS — N6310 Unspecified lump in the right breast, unspecified quadrant: Secondary | ICD-10-CM | POA: Diagnosis not present

## 2021-02-27 DIAGNOSIS — R928 Other abnormal and inconclusive findings on diagnostic imaging of breast: Secondary | ICD-10-CM

## 2021-02-27 DIAGNOSIS — R922 Inconclusive mammogram: Secondary | ICD-10-CM | POA: Diagnosis not present

## 2021-02-27 DIAGNOSIS — N6001 Solitary cyst of right breast: Secondary | ICD-10-CM | POA: Diagnosis not present

## 2021-04-16 DIAGNOSIS — M79671 Pain in right foot: Secondary | ICD-10-CM | POA: Diagnosis not present

## 2021-04-16 DIAGNOSIS — S9031XA Contusion of right foot, initial encounter: Secondary | ICD-10-CM | POA: Diagnosis not present

## 2021-04-17 DIAGNOSIS — M79671 Pain in right foot: Secondary | ICD-10-CM | POA: Diagnosis not present

## 2021-07-14 DIAGNOSIS — Z23 Encounter for immunization: Secondary | ICD-10-CM | POA: Diagnosis not present

## 2021-07-14 DIAGNOSIS — Z0000 Encounter for general adult medical examination without abnormal findings: Secondary | ICD-10-CM | POA: Diagnosis not present

## 2021-07-15 ENCOUNTER — Other Ambulatory Visit: Payer: Self-pay | Admitting: Obstetrics and Gynecology

## 2021-07-15 DIAGNOSIS — N6489 Other specified disorders of breast: Secondary | ICD-10-CM

## 2021-08-11 ENCOUNTER — Other Ambulatory Visit: Payer: Self-pay | Admitting: Internal Medicine

## 2021-08-11 NOTE — Telephone Encounter (Signed)
This is a [place] pt 

## 2021-09-01 ENCOUNTER — Ambulatory Visit
Admission: RE | Admit: 2021-09-01 | Discharge: 2021-09-01 | Disposition: A | Discharge status: Home / Self Care | Payer: BC Managed Care – PPO | Source: Ambulatory Visit | Attending: Obstetrics and Gynecology | Admitting: Obstetrics and Gynecology

## 2021-09-01 ENCOUNTER — Other Ambulatory Visit: Payer: Self-pay

## 2021-09-01 DIAGNOSIS — N6489 Other specified disorders of breast: Secondary | ICD-10-CM | POA: Insufficient documentation

## 2021-09-01 DIAGNOSIS — R922 Inconclusive mammogram: Secondary | ICD-10-CM | POA: Diagnosis not present

## 2021-09-05 ENCOUNTER — Other Ambulatory Visit: Payer: Self-pay | Admitting: Obstetrics and Gynecology

## 2021-09-05 DIAGNOSIS — Z1231 Encounter for screening mammogram for malignant neoplasm of breast: Secondary | ICD-10-CM

## 2021-09-05 DIAGNOSIS — N6489 Other specified disorders of breast: Secondary | ICD-10-CM

## 2021-10-09 ENCOUNTER — Ambulatory Visit: Payer: BC Managed Care – PPO | Admitting: Internal Medicine

## 2021-10-09 ENCOUNTER — Encounter: Payer: Self-pay | Admitting: Internal Medicine

## 2021-10-09 ENCOUNTER — Other Ambulatory Visit: Payer: Self-pay

## 2021-10-09 VITALS — BP 121/86 | HR 62 | Ht 66.0 in | Wt 149.0 lb

## 2021-10-09 DIAGNOSIS — G90A Postural orthostatic tachycardia syndrome (POTS): Secondary | ICD-10-CM

## 2021-10-09 NOTE — Progress Notes (Signed)
° ° ° ° °  Patient Care Team: Dorothey Baseman, MD as PCP - General (Family Medicine) Debbe Odea, MD as PCP - Cardiology (Cardiology)   HPI  Ann Mullen is a 46 y.o. female seen in follow-up for POTS diagnosed at [facility] by Dr. Berneice Gandy. Symptomatic orthostatic intolerance, albeit recently with negative objective findings.  She is feeling much better.  Much less lightheadedness or dizziness.  No shower intolerance.  Has a multitude of associated symptoms including urticaria, IBS, endometriosis raising the possibility of a unifying diagnosis  The patient denies chest pain, shortness of breath, nocturnal dyspnea, orthopnea or peripheral edema.  There have been no palpitations, lightheadedness or syncope.       Past Medical History:  Diagnosis Date   POTS (postural orthostatic tachycardia syndrome)    Rheumatic fever in pediatric patient     Past Surgical History:  Procedure Laterality Date   LAPAROSCOPY      Current Meds  Medication Sig   cetirizine (ZYRTEC) 10 MG tablet Take 10 mg by mouth daily.   fluticasone (FLONASE) 50 MCG/ACT nasal spray fluticasone propionate 50 mcg/actuation nasal spray,suspension   metoprolol succinate (TOPROL-XL) 25 MG 24 hr tablet Take 1 tablet (25 mg total) by mouth daily. PLEASE SCHEDULE FOLLOW UP APPOINTMENT FOR FURTHER REFILLS   Multiple Vitamin (MULTIVITAMIN ADULT PO) Take by mouth. Most days   potassium chloride (KLOR-CON) 10 MEQ tablet Take 10 mEq by mouth daily.   Probiotic Product (PROBIOTIC DAILY PO) Take by mouth daily.   SLYND 4 MG TABS Take 1 tablet by mouth daily.    Allergies  Allergen Reactions   Celexa [Citalopram]    Darvon [Propoxyphene]       Review of Systems negative except from HPI and PMH  Physical Exam BP 121/86 (BP Location: Right Arm, Patient Position: Sitting, Cuff Size: Normal)    Pulse 62    Ht 5\' 6"  (1.676 m)    Wt 149 lb (67.6 kg)    SpO2 99%    BMI 24.05 kg/m  Well developed and nourished in no acute  distress HENT normal Neck supple with JVP-  flat   Clear Regular rate and rhythm, no murmurs or gallops Abd-soft with active BS No Clubbing cyanosis edema Skin-warm and dry A & Oriented  Grossly normal sensory and motor function  ECG limb lead reversal     CrCl cannot be calculated (No successful lab value found.).   Assessment and  Plan  POTS    Elevated blood pressure and hypokalemia-renin Aldo evaluation was negative   Anxiety   Endometriosis   Sleep disordered breathing and day time somnolence   She continues to do much better.  No significant dizziness.  Hydration is reasonable.  Blood pressure is well controlled.  She continues on potassium supplementation.  The situation is much better at home

## 2021-10-09 NOTE — Patient Instructions (Signed)

## 2021-11-08 ENCOUNTER — Other Ambulatory Visit: Payer: Self-pay | Admitting: Internal Medicine

## 2022-02-03 DIAGNOSIS — Z3009 Encounter for other general counseling and advice on contraception: Secondary | ICD-10-CM | POA: Diagnosis not present

## 2022-02-03 DIAGNOSIS — N6489 Other specified disorders of breast: Secondary | ICD-10-CM | POA: Diagnosis not present

## 2022-02-03 DIAGNOSIS — Z01419 Encounter for gynecological examination (general) (routine) without abnormal findings: Secondary | ICD-10-CM | POA: Diagnosis not present

## 2022-03-27 ENCOUNTER — Ambulatory Visit
Admission: RE | Admit: 2022-03-27 | Discharge: 2022-03-27 | Disposition: A | Discharge status: Home / Self Care | Payer: BC Managed Care – PPO | Source: Ambulatory Visit | Attending: Obstetrics and Gynecology | Admitting: Obstetrics and Gynecology

## 2022-03-27 DIAGNOSIS — Z1231 Encounter for screening mammogram for malignant neoplasm of breast: Secondary | ICD-10-CM | POA: Diagnosis not present

## 2022-03-27 DIAGNOSIS — R922 Inconclusive mammogram: Secondary | ICD-10-CM | POA: Diagnosis not present

## 2022-03-27 DIAGNOSIS — N6489 Other specified disorders of breast: Secondary | ICD-10-CM | POA: Insufficient documentation

## 2022-05-06 DIAGNOSIS — Z3009 Encounter for other general counseling and advice on contraception: Secondary | ICD-10-CM | POA: Diagnosis not present

## 2022-05-13 ENCOUNTER — Other Ambulatory Visit: Payer: Self-pay | Admitting: Internal Medicine

## 2022-12-18 IMAGING — MG MM DIGITAL SCREENING BILAT W/ TOMO AND CAD
8 series · 8 of 24 positions shown · non-contrast
Comparison: Previous exam(s).

CLINICAL DATA: Screening.

EXAM:
DIGITAL SCREENING BILATERAL MAMMOGRAM WITH TOMOSYNTHESIS AND CAD
TECHNIQUE: Bilateral screening digital craniocaudal and mediolateral oblique
mammograms were obtained. Bilateral screening digital breast
tomosynthesis was performed. The images were evaluated with
computer-aided detection.

[R CC synth-2D]
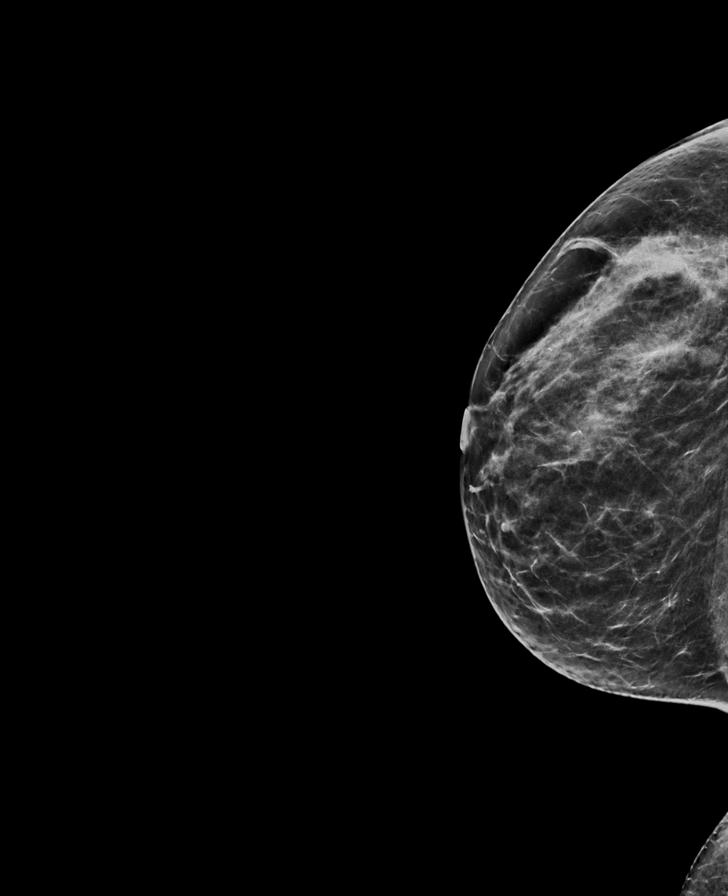

[L CC synth-2D]
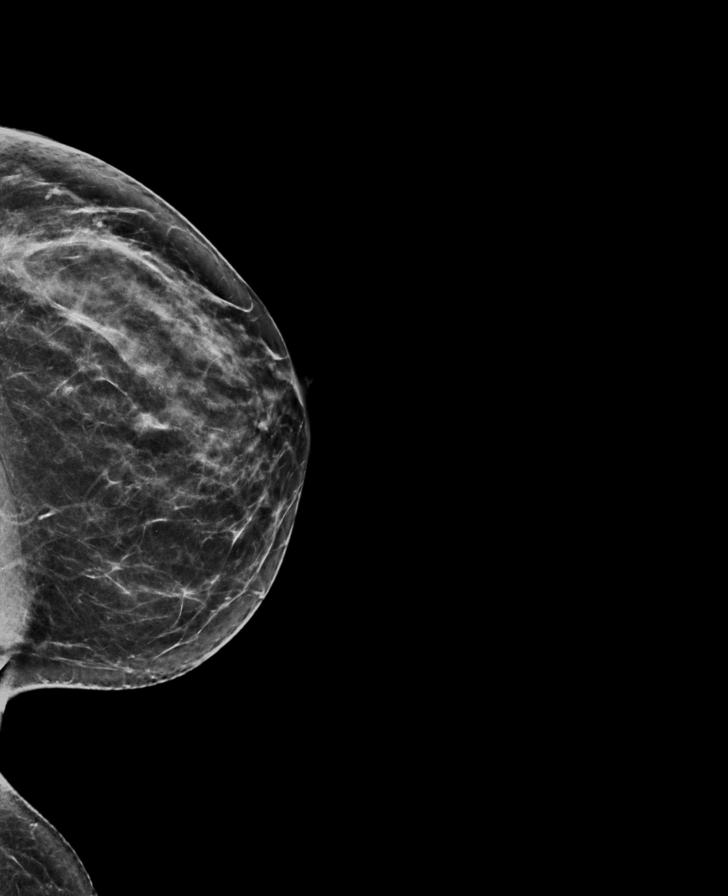

[R MLO synth-2D]
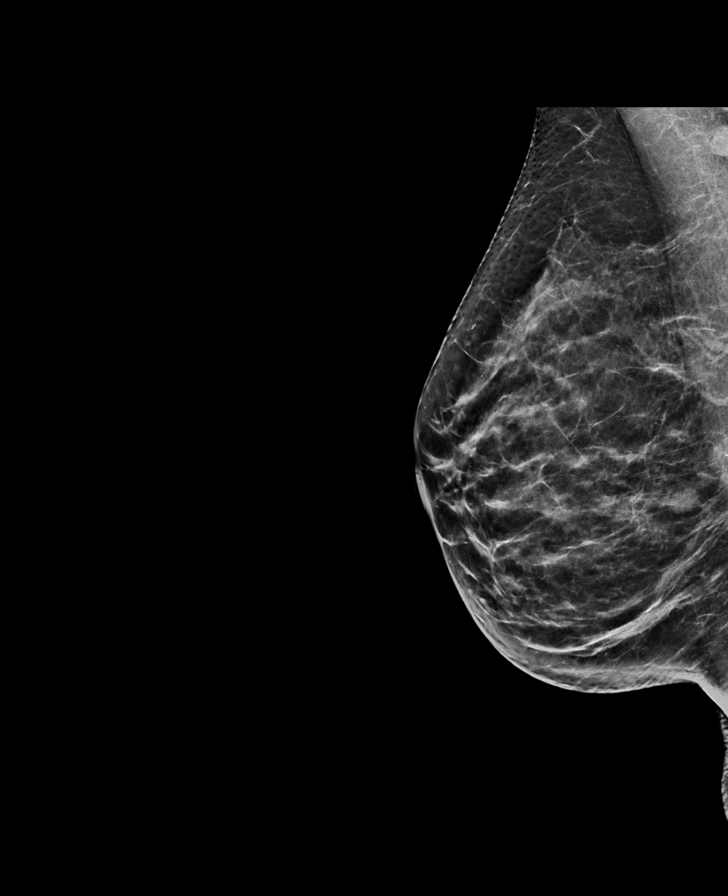

[L MLO synth-2D]
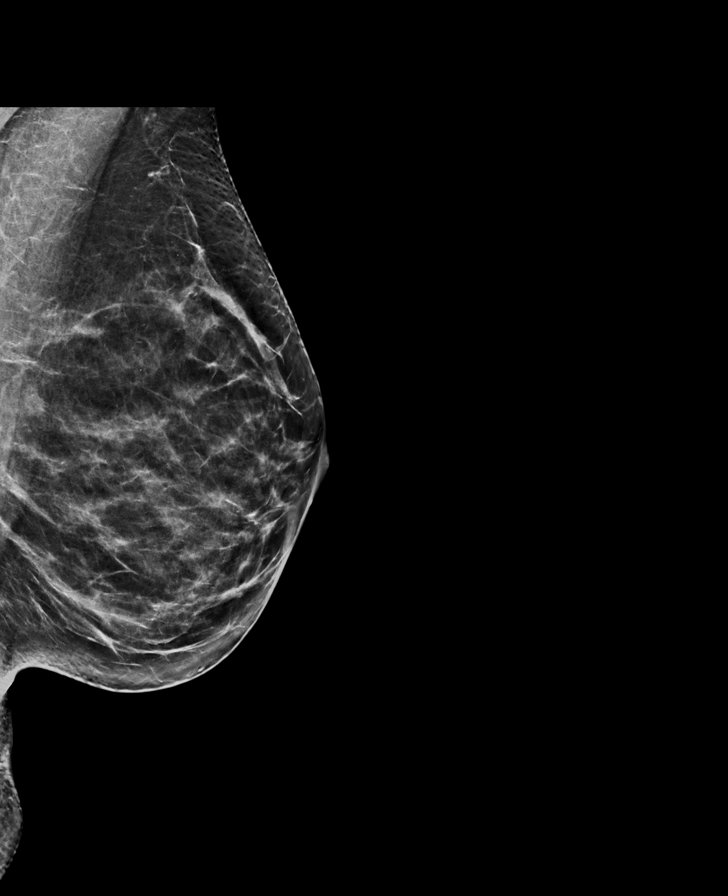

[L CC tomo · tomo slice 34/67.0]
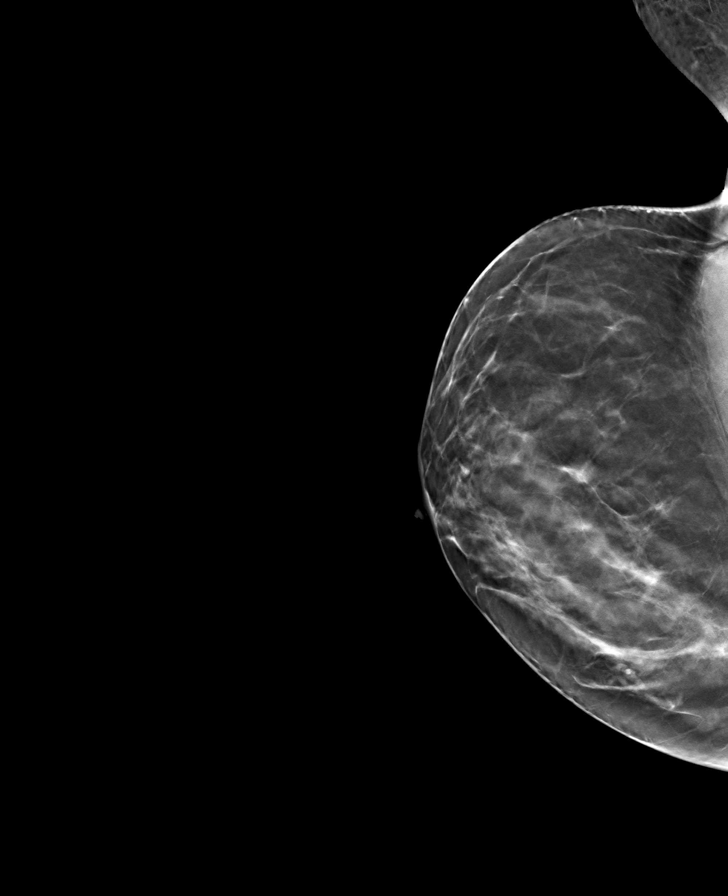

[R MLO tomo · tomo slice 32/63.0]
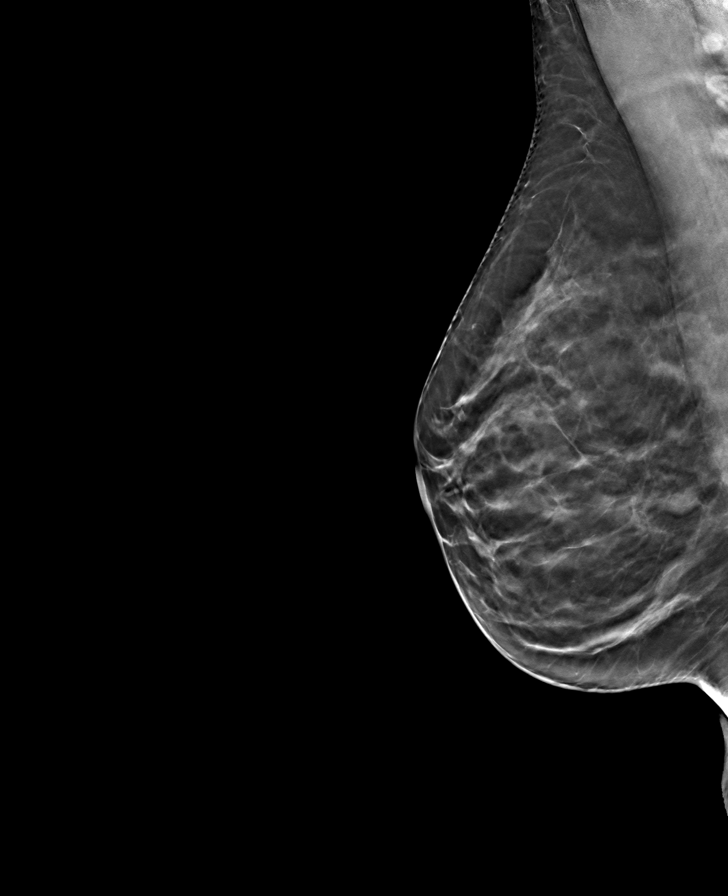

[R CC tomo · tomo slice 33/64.0]
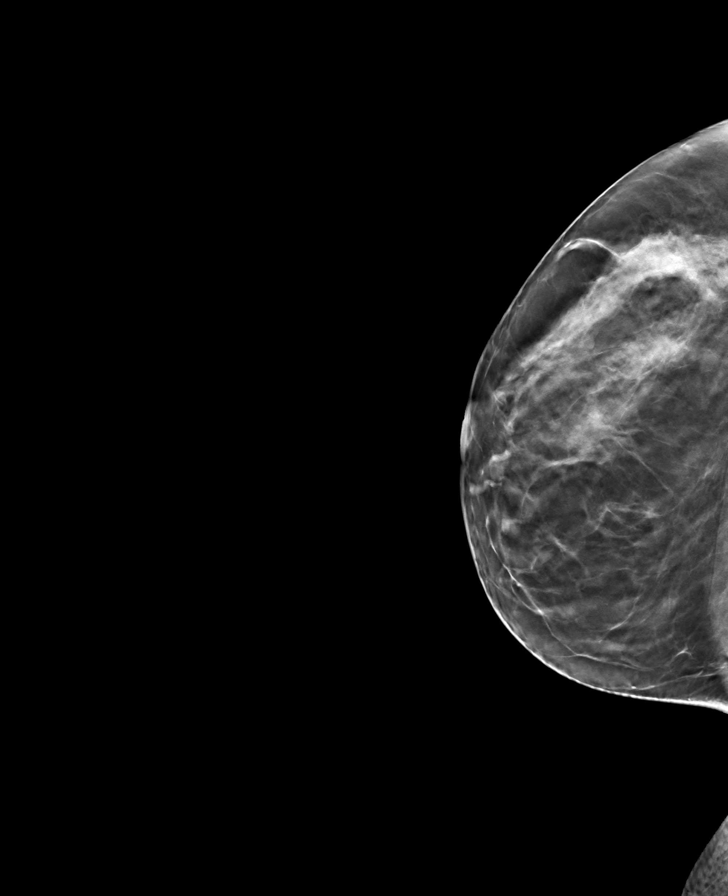

[L MLO tomo · tomo slice 33/66.0]
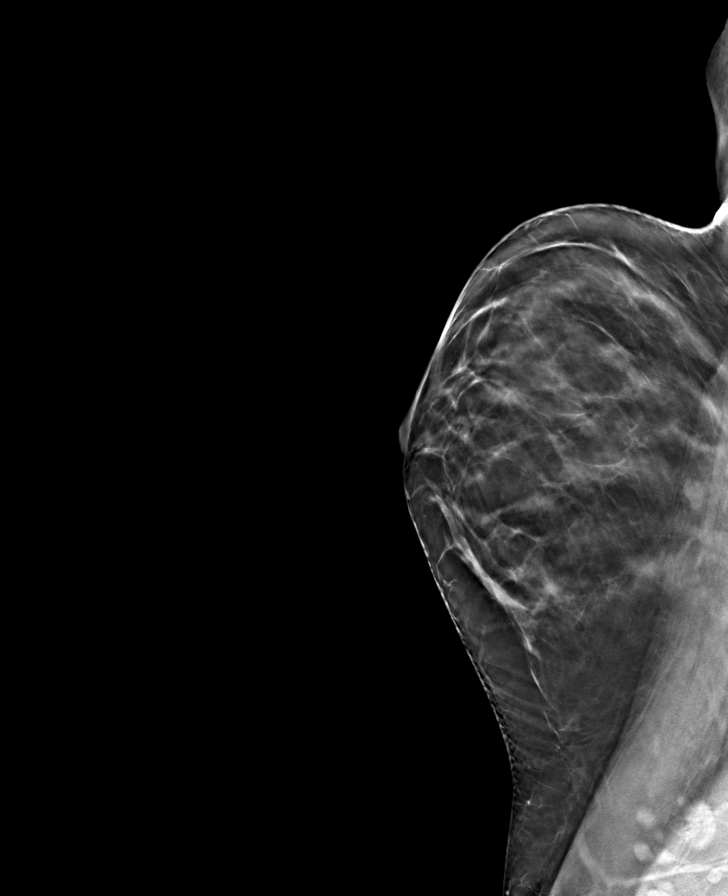

[8 of 24 positions shown; findings below may reference images not displayed]

ACR Breast Density Category c: The breast tissue is heterogeneously
dense, which may obscure small masses.
FINDINGS: In the right breast possible mass requires further evaluation.

In the left breast possible masses require further evaluation.
IMPRESSION: Further evaluation is suggested for possible mass in the right
breast.

Further evaluation is suggested for possible masses in the left
breast.

RECOMMENDATION:
Diagnostic mammogram and possibly ultrasound of both breasts.
(Code:[id number]-3-00Z)

The patient will be contacted regarding the findings, and additional
imaging will be scheduled.

BI-RADS CATEGORY  0: Incomplete. Need additional imaging evaluation
and/or prior mammograms for comparison.

## 2023-01-24 NOTE — Progress Notes (Unsigned)
Cardiology Office Note Date:  01/25/2023  Patient ID:  Ann Mullen, Ann Mullen 03/04/1976, MRN [id number] PCP:  Dorothey Baseman, MD  Cardiologist:  Debbe Odea, MD Electrophysiologist: Sherryl Manges, MD    Chief Complaint: 1 year POTS follow-up  History of Present Illness: [name] is a 47 y.o. female with PMH notable for POTS; seen today for Sherryl Manges, MD for routine electrophysiology followup.  Last saw Dr. Graciela Husbands 09/2021, was doing well with much less lightheadedness or dizziness.   Today, she continues to overall feel well. Minimal dizziness. She is a 2nd grade teacher, and so finds it difficult to keep up with oral hydration during the workday, also difficulty going to bathroom often if she does keep PO intake high.  Is currently out for the summer. Does not prefer gatorade, mostly drinks water. Has not tried other electrolyte replacement drinks/powders. She has intermittent tingling in R hand.   She regularly exercises doing crunches or wall pilates.  No chest pain, chest pressure, SOB. No syncope, or presyncope.   Past Medical History:  Diagnosis Date   POTS (postural orthostatic tachycardia syndrome)    Rheumatic fever in pediatric patient     Past Surgical History:  Procedure Laterality Date   LAPAROSCOPY      Current Outpatient Medications  Medication Instructions   cetirizine (ZYRTEC) 10 mg, Oral, Daily   fluticasone (FLONASE) 50 MCG/ACT nasal spray fluticasone propionate 50 mcg/actuation nasal spray,suspension   metoprolol succinate (TOPROL-XL) 25 MG 24 hr tablet TAKE 1 TABLET BY MOUTH DAILY   Multiple Vitamin (MULTIVITAMIN ADULT PO) Oral, Most days    potassium chloride (KLOR-CON) 10 MEQ tablet 10 mEq, Oral, Daily   Probiotic Product (PROBIOTIC DAILY PO) Oral, Daily   SLYND 4 MG TABS 1 tablet, Oral, Daily    Social History:  The patient  reports that she has never smoked. She has never used smokeless tobacco. She reports that she does not currently use  alcohol. She reports that she does not currently use drugs.   Family History:  The patient's family history includes Hypertension in her father.  ROS:  Please see the history of present illness. All other systems are reviewed and otherwise negative.   PHYSICAL EXAM:  VS:  BP 102/80 (BP Location: Left Arm, Patient Position: Sitting, Cuff Size: Normal)   Pulse 69   Ht 5\' 6"  (1.676 m)   Wt 149 lb 8 oz (67.8 kg)   SpO2 98%   BMI 24.13 kg/m  BMI: Body mass index is 24.13 kg/m.  GEN- The patient is well appearing, alert and oriented x 3 today.   Lungs- Clear to ausculation bilaterally, normal work of breathing.  Heart- Regular rate and rhythm, no murmurs, rubs or gallops Extremities- No peripheral edema, warm, dry   EKG is ordered. Personal review of EKG from today shows:  NSR, rate 69  Recent Labs: No results found for requested labs within last 365 days.  No results found for requested labs within last 365 days.   CrCl cannot be calculated (No successful lab value found.).   Wt Readings from Last 3 Encounters:  01/25/23 149 lb 8 oz (67.8 kg)  10/09/21 149 lb (67.6 kg)  02/06/21 142 lb 12.8 oz (64.8 kg)     Additional studies reviewed include: Previous EP, cardiology notes.   TTE, 04/16/2020  1. Left ventricular ejection fraction, by estimation, is 60 to 65%. The left ventricle has normal function. The left ventricle has no regional wall motion abnormalities. Left ventricular diastolic  parameters were normal.   2. Right ventricular systolic function is normal. The right ventricular size is normal. There is normal pulmonary artery systolic pressure.   3. The mitral valve is normal in structure. Trivial mitral valve regurgitation. No evidence of mitral stenosis.   4. The aortic valve was not well visualized. Aortic valve regurgitation is not visualized. No aortic stenosis is present.   5. The inferior vena cava is normal in size with greater than 50% respiratory variability,  suggesting right atrial pressure of 3 mmHg.     ASSESSMENT AND PLAN:  #) POTS Symptoms overall well-controlled Encouraged her to find ways to increase oral intake during workday, like before school, at lunch, and immediately after school Encouraged her to try different electrolyte replacement drinks Keep up with increased salt Cont floor exercises and wall pilates Continue toprol 25 daily    Current medicines are reviewed at length with the patient today.   The patient does not have concerns regarding her medicines.  The following changes were made today:  none  Labs/ tests ordered today include:  Orders Placed This Encounter  Procedures   EKG 12-Lead     Disposition: Follow up with Dr. Graciela Husbands or EP APP in 12 months   Signed, Sherie Don, NP  01/25/23  11:38 AM  Electrophysiology [facility]

## 2023-01-25 ENCOUNTER — Ambulatory Visit: Payer: BC Managed Care – PPO | Attending: Cardiology | Admitting: Cardiology

## 2023-01-25 ENCOUNTER — Encounter: Payer: Self-pay | Admitting: Cardiology

## 2023-01-25 VITALS — BP 102/80 | HR 69 | Ht 66.0 in | Wt 149.5 lb

## 2023-01-25 DIAGNOSIS — G90A Postural orthostatic tachycardia syndrome (POTS): Secondary | ICD-10-CM | POA: Diagnosis not present

## 2023-01-25 NOTE — Patient Instructions (Signed)
Medication Instructions:   Your physician recommends that you continue on your current medications as directed. Please refer to the Current Medication list given to you today.  *If you need a refill on your cardiac medications before your next appointment, please call your pharmacy*   Lab Work:  No lab work ordered today.  If you have labs (blood work) drawn today and your tests are completely normal, you will receive your results only by: MyChart Message (if you have MyChart) OR A paper copy in the mail If you have any lab test that is abnormal or we need to change your treatment, we will call you to review the results.   Testing/Procedures:  No testing ordered today.   Follow-Up: At [facility], you and your health needs are our priority.  As part of our continuing mission to provide you with exceptional heart care, we have created designated Provider Care Teams.  These Care Teams include your primary Cardiologist (physician) and Advanced Practice Providers (APPs -  Physician Assistants and Nurse Practitioners) who all work together to provide you with the care you need, when you need it.  We recommend signing up for the patient portal called "MyChart".  Sign up information is provided on this After Visit Summary.  MyChart is used to connect with patients for Virtual Visits (Telemedicine).  Patients are able to view lab/test results, encounter notes, upcoming appointments, etc.  Non-urgent messages can be sent to your provider as well.   To learn more about what you can do with MyChart, go to [email or web address].    Your next appointment:   12 month(s)  Provider:   Steven Klein, MD  or Suzann Riddle, NP    

## 2023-05-10 ENCOUNTER — Other Ambulatory Visit: Payer: Self-pay

## 2023-05-10 MED ORDER — METOPROLOL SUCCINATE ER 25 MG PO TB24: 25.0000 mg | ORAL_TABLET | Freq: Every day | ORAL | 2 refills | Status: DC

## 2023-06-21 ENCOUNTER — Other Ambulatory Visit: Payer: Self-pay | Admitting: Obstetrics and Gynecology

## 2023-06-21 DIAGNOSIS — Z1231 Encounter for screening mammogram for malignant neoplasm of breast: Secondary | ICD-10-CM

## 2023-07-06 ENCOUNTER — Ambulatory Visit
Admission: RE | Admit: 2023-07-06 | Discharge: 2023-07-06 | Disposition: A | Discharge status: Home / Self Care | Payer: BC Managed Care – PPO | Source: Ambulatory Visit | Attending: Obstetrics and Gynecology | Admitting: Obstetrics and Gynecology

## 2023-07-06 DIAGNOSIS — Z1231 Encounter for screening mammogram for malignant neoplasm of breast: Secondary | ICD-10-CM | POA: Insufficient documentation

## 2023-11-06 ENCOUNTER — Other Ambulatory Visit: Payer: Self-pay | Admitting: Cardiology

## 2023-12-11 ENCOUNTER — Other Ambulatory Visit: Payer: Self-pay | Admitting: Cardiology

## 2024-03-06 NOTE — Progress Notes (Unsigned)
 Electrophysiology Clinic Note    Date:  03/07/2024  Patient ID:  Ann Mullen, Ann Mullen 11/14/1975, MRN [id number] PCP:  Glover Lenis, MD  Cardiologist:  Redell Cave, MD   Electrophysiologist:  Elspeth Sage, MD  Electrophysiology APP:  Delano Frate, NP      Discussed the use of AI scribe software for clinical note transcription with the patient, who gave verbal consent to proceed.   Patient Profile    Chief Complaint: POTS follow-up  History of Present Illness: Ann Mullen is a 48 y.o. female with PMH notable for POTS; seen today for Elspeth Sage, MD for routine electrophysiology followup.   I last saw her 01/2023 for routine follow-up of her POTS.  She was having difficulty maintaining high p.o. 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  Overall, she was feeling relatively well with minimal dizziness.  On follow-up today, she has noticed a worsening of her symptoms over the past couple months, after being relatively asymptomatic for many months. Her husband lost his job about 3 months ago, but thinks that her symptoms worsened prior to this. She continues to struggle with electrolyte beverages, does not prefer the taste.    Arrhythmia/Device History No specialty comments available.      ROS:  Please see the history of present illness. All other systems are reviewed and otherwise negative.    Physical Exam    VS:  BP (!) 154/79 (BP Location: Left Arm, Patient Position: Sitting, Cuff Size: Normal)   Pulse 67   Ht 5' 6 (1.676 m)   Wt 147 lb (66.7 kg)   SpO2 99%   BMI 23.73 kg/m  BMI: Body mass index is 23.73 kg/m.  Orthostatic VS for the past 24 hrs (Last 3 readings):  BP- Lying Pulse- Lying BP- Sitting Pulse- Sitting BP- Standing at 0 minutes Pulse- Standing at 0 minutes BP- Standing at 3 minutes Pulse- Standing at 3 minutes  03/07/24 1303 121/79 70 127/87 73 134/89 86 (!) 142/95 90     Wt Readings from Last 3 Encounters:  03/07/24 147 lb  (66.7 kg)  01/25/23 149 lb 8 oz (67.8 kg)  10/09/21 149 lb (67.6 kg)     GEN- The patient is well appearing, alert and oriented x 3 today.   Lungs- Clear to ausculation bilaterally, normal work of breathing.  Heart- Regular rate and rhythm, no murmurs, rubs or gallops Extremities- No peripheral edema, warm, dry    Studies Reviewed   Previous EP, cardiology notes.    EKG is ordered. Personal review of EKG from today shows:    EKG Interpretation Date/Time:  Tuesday March 07 2024 12:55:36 EDT Ventricular Rate:  67 PR Interval:  150 QRS Duration:  80 QT Interval:  374 QTC Calculation: 395 R Axis:   29  Text Interpretation: Normal sinus rhythm Normal ECG Confirmed by Vala Raffo [phone]) on 03/07/2024 1:07:49 PM    TTE, 04/16/2020  1. Left ventricular ejection fraction, by estimation, is 60 to 65%. The left ventricle has normal function. The left ventricle has no regional wall motion abnormalities. Left ventricular diastolic parameters were normal.   2. Right ventricular systolic function is normal. The right ventricular size is normal. There is normal pulmonary artery systolic pressure.   3. The mitral valve is normal in structure. Trivial mitral valve regurgitation. No evidence of mitral stenosis.   4. The aortic valve was not well visualized. Aortic valve regurgitation is not visualized. No aortic stenosis is present.  5. The inferior vena cava is normal in size with greater than 50% respiratory variability, suggesting right atrial pressure of 3 mmHg.      Assessment and Plan     #) POTS We discussed the role of salt and water repletion, encouraged her to continue to trial electrolyte beverages.  We discussed salt supplements are best used with adjunctive sugar Consider thigh/abd compression Continue regular exercise       Current medicines are reviewed at length with the patient today.   The patient does not have concerns regarding her medicines.  The following  changes were made today:  none  Labs/ tests ordered today include:  Orders Placed This Encounter  Procedures   EKG 12-Lead     Disposition: Follow up with Dr. Kennyth or EP APP in 12 months, prefer MD to establish care   Signed, Chantal Needle, NP  03/07/24  1:25 PM  Electrophysiology [facility]

## 2024-03-07 ENCOUNTER — Ambulatory Visit: Attending: Cardiology | Admitting: Cardiology

## 2024-03-07 VITALS — BP 154/79 | HR 67 | Ht 66.0 in | Wt 147.0 lb

## 2024-03-07 DIAGNOSIS — G90A Postural orthostatic tachycardia syndrome (POTS): Secondary | ICD-10-CM

## 2024-03-07 NOTE — Patient Instructions (Signed)
 Medication Instructions:  The current medical regimen is effective;  continue present plan and medications as directed. Please refer to the Current Medication list given to you today.   *If you need a refill on your cardiac medications before your next appointment, please call your pharmacy*  Follow-Up: At [facility], you and your health needs are our priority.  As part of our continuing mission to provide you with exceptional heart care, our providers are all part of one team.  This team includes your primary Cardiologist (physician) and Advanced Practice Providers or APPs (Physician Assistants and Nurse Practitioners) who all work together to provide you with the care you need, when you need it.  Your next appointment:   12 month(s)  Provider:   Suzann Riddle, NP    We recommend signing up for the patient portal called "MyChart".  Sign up information is provided on this After Visit Summary.  MyChart is used to connect with patients for Virtual Visits (Telemedicine).  Patients are able to view lab/test results, encounter notes, upcoming appointments, etc.  Non-urgent messages can be sent to your provider as well.   To learn more about what you can do with MyChart, go to [email or web address].
